# Patient Record
Sex: Female | Born: 1965 | Race: White | Hispanic: No | Marital: Married | Smoking: Never smoker
Health system: Southern US, Community
[De-identification: ages and names within clinical notes are randomized; demographics above are authoritative.]

## PROBLEM LIST (undated history)

## (undated) DIAGNOSIS — F32A Depression, unspecified: Secondary | ICD-10-CM

## (undated) DIAGNOSIS — I10 Essential (primary) hypertension: Secondary | ICD-10-CM

## (undated) DIAGNOSIS — K760 Fatty (change of) liver, not elsewhere classified: Secondary | ICD-10-CM

## (undated) DIAGNOSIS — E7800 Pure hypercholesterolemia, unspecified: Secondary | ICD-10-CM

## (undated) DIAGNOSIS — F419 Anxiety disorder, unspecified: Secondary | ICD-10-CM

## (undated) DIAGNOSIS — F329 Major depressive disorder, single episode, unspecified: Secondary | ICD-10-CM

## (undated) HISTORY — PX: FOOT SURGERY: SHX648

## (undated) HISTORY — PX: KNEE SURGERY: SHX244

---

## 2016-12-11 ENCOUNTER — Ambulatory Visit
Admission: EM | Admit: 2016-12-11 | Discharge: 2016-12-11 | Disposition: A | Discharge status: Home / Self Care | Payer: BC Managed Care – PPO | Attending: Family Medicine | Admitting: Family Medicine

## 2016-12-11 ENCOUNTER — Ambulatory Visit (INDEPENDENT_AMBULATORY_CARE_PROVIDER_SITE_OTHER): Payer: BC Managed Care – PPO

## 2016-12-11 DIAGNOSIS — S90851A Superficial foreign body, right foot, initial encounter: Secondary | ICD-10-CM

## 2016-12-11 HISTORY — DX: Major depressive disorder, single episode, unspecified: F32.9

## 2016-12-11 HISTORY — DX: Depression, unspecified: F32.A

## 2016-12-11 HISTORY — DX: Anxiety disorder, unspecified: F41.9

## 2016-12-11 HISTORY — DX: Essential (primary) hypertension: I10

## 2016-12-11 HISTORY — DX: Pure hypercholesterolemia, unspecified: E78.00

## 2016-12-11 HISTORY — DX: Fatty (change of) liver, not elsewhere classified: K76.0

## 2016-12-11 NOTE — ED Triage Notes (Signed)
Pt stepped on some broken glass last evening in her kitchen. Right foot lateral plantar region. Pain 4/10

## 2016-12-11 NOTE — Discharge Instructions (Signed)
Keep area clean and dry. Apply an anabolic ointment over the area 3 times daily until healed. Watch for signs and symptoms of any impending infection if these occur return to clinic immediately.

## 2016-12-11 NOTE — ED Notes (Signed)
Right foot cleansed with NS and Sureclens.

## 2016-12-11 NOTE — ED Provider Notes (Signed)
[facility]    CSN: [id number] Arrival date & time: 12/11/16  1029     History   Chief Complaint Chief Complaint  Patient presents with  . Foreign Body in Skin    HPI Kathryn Carpenter is a 51 y.o. female.   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 She states it was a Pyrex bowl had broken; her husband had tried to clean it up but evidently had left  some undiscovered pieces. Today at work she noticed that it was more painful as she was walking and presented for evaluation. She states that her tetanus is current.         Past Medical History:  Diagnosis Date  . Anxiety   . Depression   . Fatty liver   . High cholesterol   . Hypertension     There are no active problems to display for this patient.   Past Surgical History:  Procedure Laterality Date  . FOOT SURGERY    . KNEE SURGERY      OB History    No data available       Home Medications    Prior to Admission medications   Medication Sig Start Date End Date Taking? Authorizing Provider  amLODipine (NORVASC) 10 MG tablet Take 10 mg by mouth daily.   Yes [provider]  buPROPion (WELLBUTRIN XL) 150 MG 24 hr tablet Take 150 mg by mouth daily.   Yes [provider]  busPIRone (BUSPAR) 7.5 MG tablet Take 7.5 mg by mouth 3 (three) times daily.   Yes [provider]  Multiple Vitamin (MULTIVITAMIN WITH MINERALS) TABS tablet Take 1 tablet by mouth daily.   Yes [provider]    Family History History reviewed. No pertinent family history.  Social History Social History  Substance Use Topics  . Smoking status: Never Smoker  . Smokeless tobacco: Never Used  . Alcohol use Yes     Comment: social     Allergies   Patient has no known allergies.   Review of Systems Review of Systems  Constitutional: Positive for activity change, chills, fatigue and fever.  Skin: Positive for wound.  All other  systems reviewed and are negative.    Physical Exam Triage Vital Signs ED Triage Vitals  Enc Vitals Group     BP 12/11/16 1107 (!) 150/114     Pulse Rate 12/11/16 1107 68     Resp 12/11/16 1107 16     Temp 12/11/16 1107 98.6 F (37 C)     Temp Source 12/11/16 1107 Oral     SpO2 12/11/16 1107 99 %     Weight 12/11/16 1107 190 lb (86.2 kg)     Height 12/11/16 1107  (1.676 m)     Head Circumference --      Peak Flow --      Pain Score 12/11/16 1108 4     Pain Loc --      Pain Edu? --      Excl. in GC? --    No data found.   Updated Vital Signs BP (!) 150/114 (BP Location: Left Arm)   Pulse 68   Temp 98.6 F (37 C) (Oral)   Resp 16   Ht  (1.676 m)   Wt 190 lb (86.2 kg)   LMP 11/20/2016 Comment: denies preg  SpO2 99%   BMI 30.67 kg/m   Visual Acuity Right  Eye Distance:   Left Eye Distance:   Bilateral Distance:    Right Eye Near:   Left Eye Near:    Bilateral Near:     Physical Exam  Constitutional: She is oriented to person, place, and time. She appears well-developed and well-nourished. No distress.  HENT:  Head: Normocephalic.  Eyes: Pupils are equal, round, and reactive to light. Right eye exhibits no discharge. Left eye exhibits no discharge.  Neck: Normal range of motion.  Musculoskeletal: Normal range of motion.  Examination of the right foot on plantar surface overlying the MTP head of the fifth digit shows a small of puncture wound over the lateral inferior portion. No drainage is present. There is some swelling and tenderness to the deep palpation.  Neurological: She is alert and oriented to person, place, and time.  Skin: Skin is warm and dry. She is not diaphoretic. There is erythema.  Psychiatric: She has a normal mood and affect. Her behavior is normal. Judgment and thought content normal.  Nursing note and vitals reviewed.    UC Treatments / Results  Labs (all labs ordered are listed, but only abnormal results are displayed) Labs  Reviewed - No data to display  EKG  EKG Interpretation None       Radiology Dg Foot Complete Right  Result Date: 12/11/2016 CLINICAL DATA:  Pt states she stepped on glass from dish that had broken last pm. Feels like there is something still in there. Area marked with BB. Entry wound is just under and lateral to head of 5th MT bone area EXAM: RIGHT FOOT COMPLETE - 3+ VIEW COMPARISON:  None. FINDINGS: There is a radiopaque density measuring approximately 7 mm in length adjacent to the lateral aspect of the cuboid, possibly representing a radiopaque foreign body. Alternatively this may represent an accessory ossicle and correlation is recommended with point tenderness. In the region of the fifth metatarsophalangeal joint, there is a punctate radiopaque density measuring less than 1 mm in diameter. A second similar density measures approximately 1 mm. There is mild soft tissue edema in the lateral aspect of the foot. No acute fracture or subluxation. Small plantar spur is noted. IMPRESSION: 1. Possible foreign bodies as described. 2. Lateral soft tissue swelling. 3. Plantar calcaneal spur. Electronically Signed   By: Norva Pavlov M.D.   On: 12/11/2016 12:24    Procedures Procedures (including critical care time)  The area was prepped widely with Hibiclens cleanser. An 18 gauge needle was then utilized to carefully tease out the foreign body which was a piece of glass. No other foreign bodies were encountered. A dry sterile dressing was applied      Medications Ordered in UC Medications - No data to display   Initial Impression / Assessment and Plan / UC Course  I have reviewed the triage vital signs and the nursing notes.  Pertinent labs & imaging results that were available during my care of the patient were reviewed by me and considered in my medical decision making (see chart for details).     Plan: 1. Test/x-ray results and diagnosis reviewed with patient 2. rx as per  orders; risks, benefits, potential side effects reviewed with patient 3. Recommend supportive treatment with keeping the area clean and dry. Apply triple antibiotic ointment 3 times daily and apply dry dressing. Do This until healed. Watch for signs symptoms of infection. If these occur return to the clinic. 4. F/u prn if symptoms worsen or don't improve   Final Clinical Impressions(s) /  UC Diagnoses   Final diagnoses:  Foreign body in right foot, initial encounter    New Prescriptions Discharge Medication List as of 12/11/2016 12:44 PM       Controlled Substance Prescriptions [place] Controlled Substance Registry consulted? Not Applicable   Lutricia Feil, PA-C 12/11/16 1347

## 2018-04-22 IMAGING — CR DG FOOT COMPLETE 3+V*R*
3 series · 4 of 4 positions shown · non-contrast
Comparison: None.

CLINICAL DATA: Pt states she stepped on glass from dish that had
broken last pm. Feels like there is something still in there. Area
marked with BB. Entry wound is just under and lateral to head of 5th
MT bone area

EXAM:
RIGHT FOOT COMPLETE - 3+ VIEW

[foot ap]
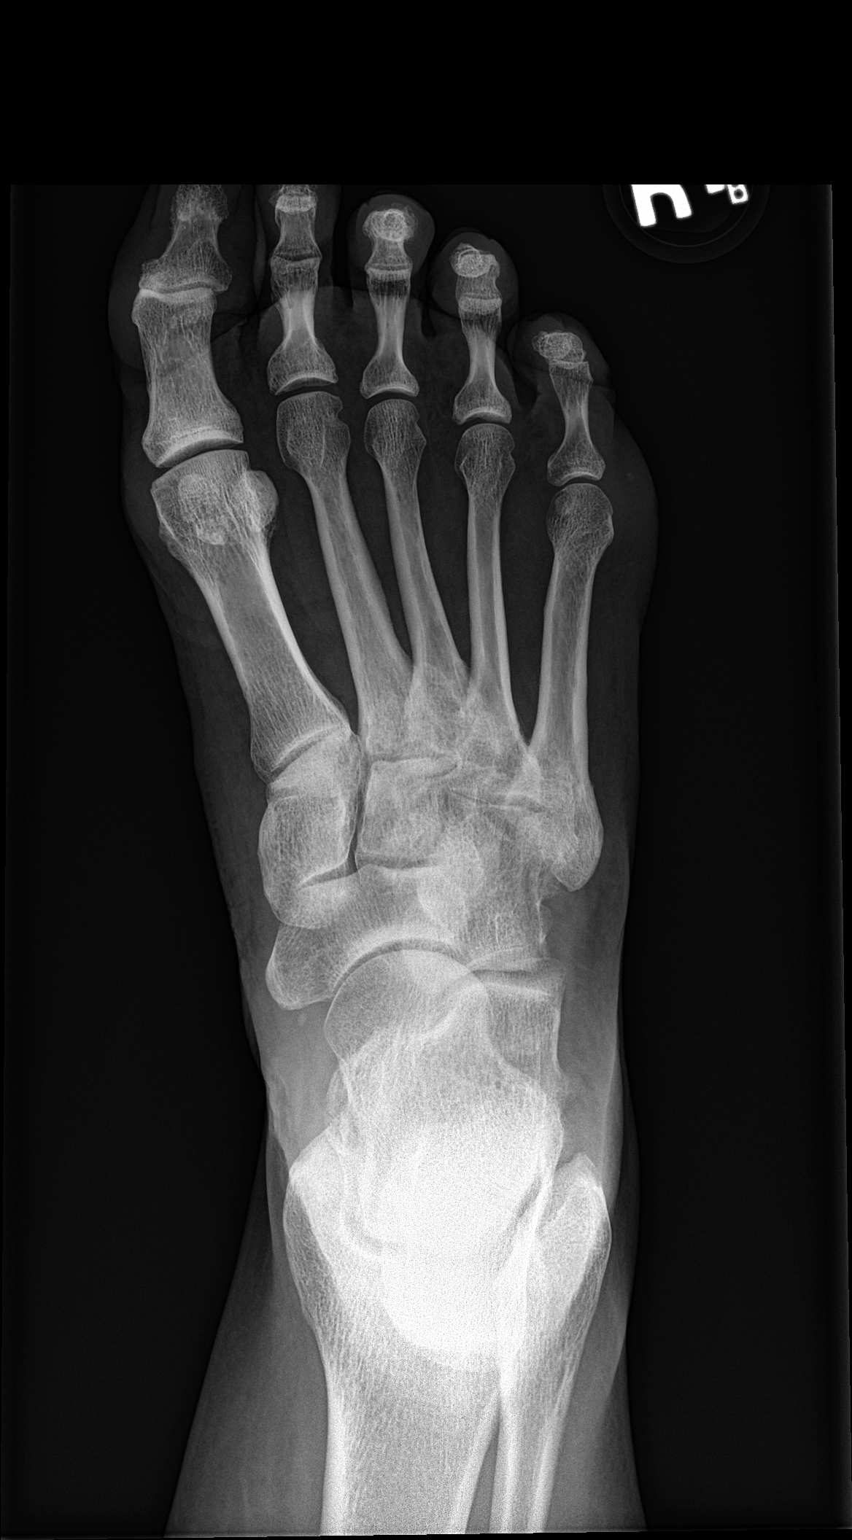

[foot obl]
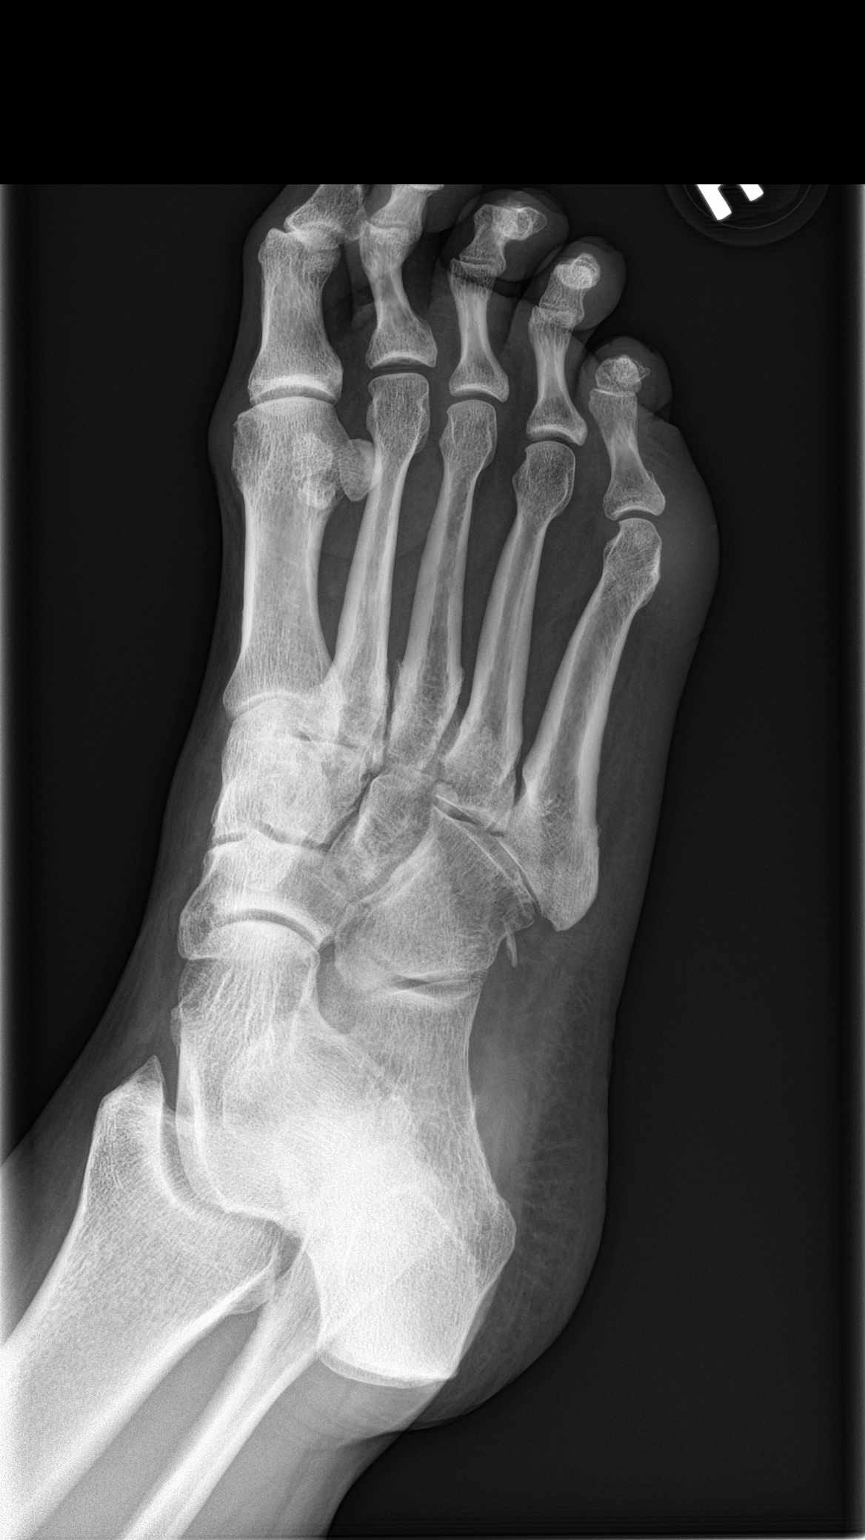

[Series 3: foot lat · 0.14mm/px · 2 of 2 slices shown]
[im 1/2]
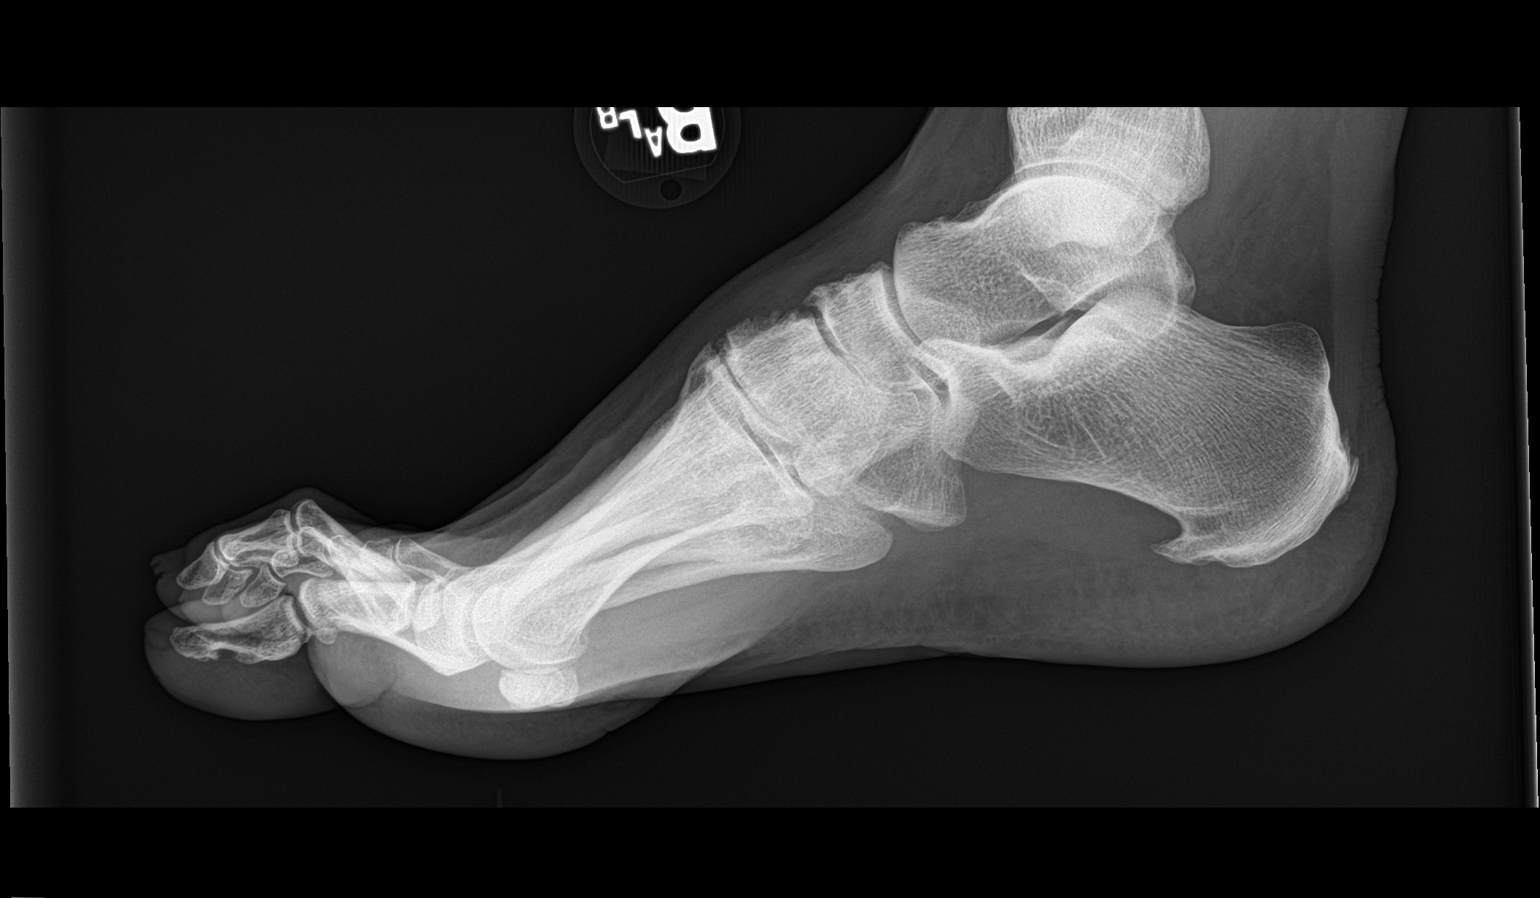
[im 2/2]
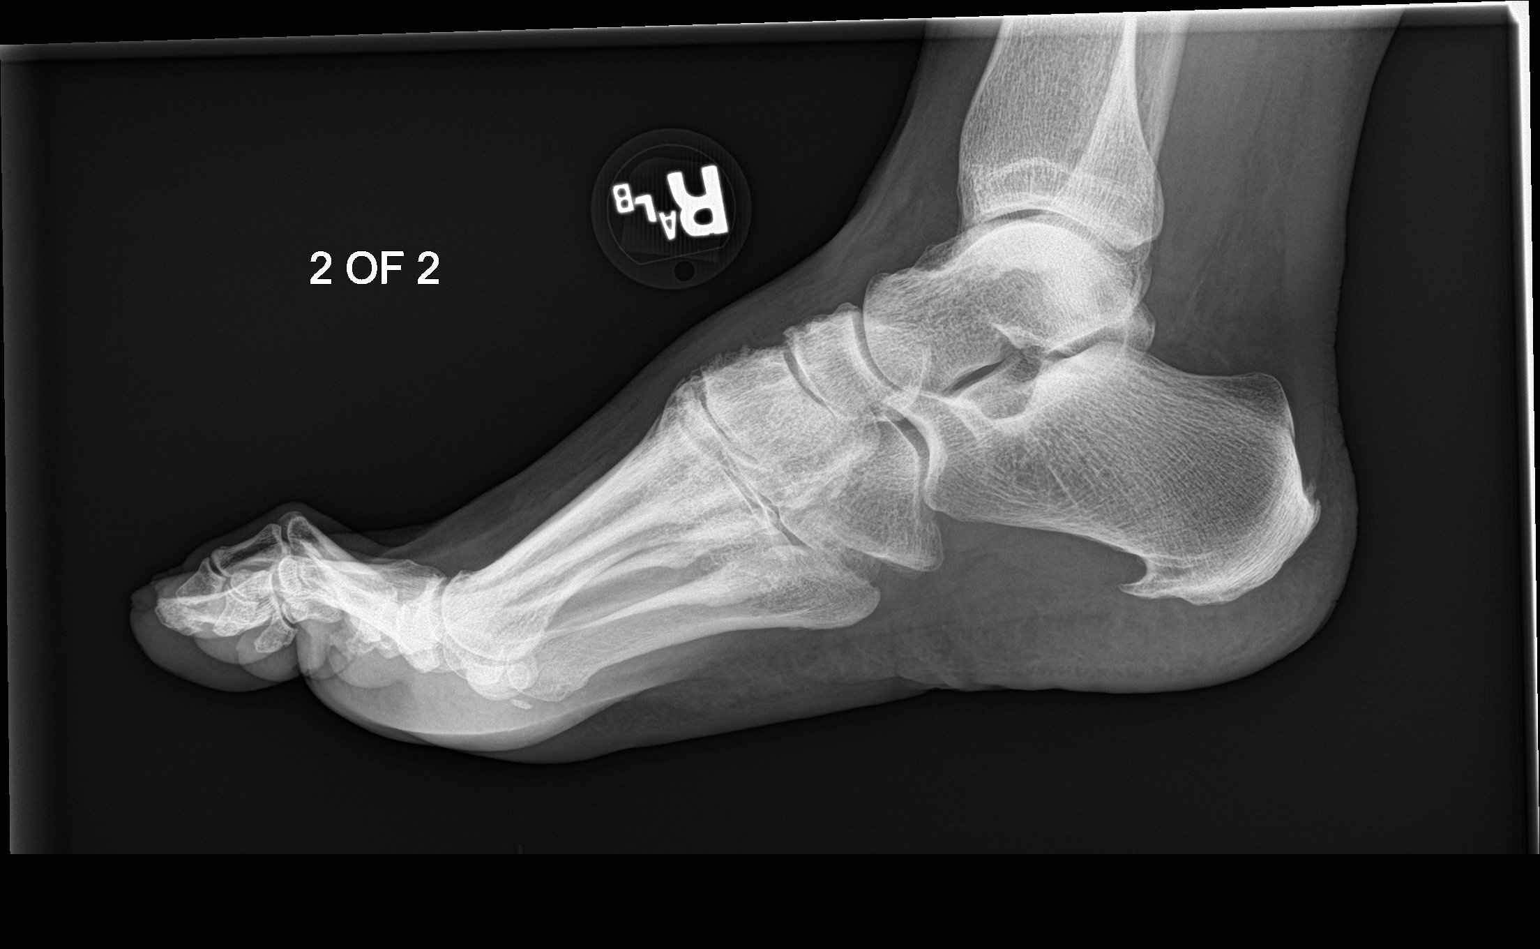

[4 of 4 positions shown; findings below may reference images not displayed]

FINDINGS: There is a radiopaque density measuring approximately 7 mm in length
adjacent to the lateral aspect of the cuboid, possibly representing
a radiopaque foreign body. Alternatively this may represent an
accessory ossicle and correlation is recommended with point
tenderness.

In the region of the fifth metatarsophalangeal joint, there is a
punctate radiopaque density measuring less than 1 mm in diameter. A
second similar density measures approximately 1 mm.

There is mild soft tissue edema in the lateral aspect of the foot.
No acute fracture or subluxation. Small plantar spur is noted.
IMPRESSION: 1. Possible foreign bodies as described.
2. Lateral soft tissue swelling.
3. Plantar calcaneal spur.
